# Patient Record
Sex: Female | Born: 1965 | Race: Black or African American | Hispanic: No | Marital: Married | Smoking: Former smoker
Health system: Southern US, Community
[De-identification: ages and names within clinical notes are randomized; demographics above are authoritative.]

## PROBLEM LIST (undated history)

## (undated) DIAGNOSIS — J449 Chronic obstructive pulmonary disease, unspecified: Secondary | ICD-10-CM

## (undated) DIAGNOSIS — I471 Supraventricular tachycardia, unspecified: Secondary | ICD-10-CM

## (undated) DIAGNOSIS — J45909 Unspecified asthma, uncomplicated: Secondary | ICD-10-CM

## (undated) HISTORY — PX: OTHER SURGICAL HISTORY: SHX169

## (undated) HISTORY — DX: Chronic obstructive pulmonary disease, unspecified: J44.9

## (undated) HISTORY — DX: Unspecified asthma, uncomplicated: J45.909

## (undated) HISTORY — DX: Supraventricular tachycardia: I47.1

## (undated) HISTORY — DX: Supraventricular tachycardia, unspecified: I47.10

---

## 2019-03-29 ENCOUNTER — Encounter: Payer: Self-pay | Admitting: *Deleted

## 2019-03-29 NOTE — Progress Notes (Signed)
Oncology Nurse Navigator Documentation  Oncology Nurse Navigator Flowsheets 03/29/2019  Navigator Location [facility]  Referral date to RadOnc/MedOnc 03/29/2019  Navigator Encounter Type Telephone/I received referral on Tonya Gordon.  I called referring cancer center to check on patient's scans CD.  I was told they did not have that.  I called the patient.  I was updated that she does not have that either.  Patient is going to call and have the hospital send her a Cd of her scans.  She will call me back with an update.  I will get her scheduled with Dr. Julien Nordmann after the update. Patient verbalized understanding  Telephone Outgoing Call  Barriers/Navigation Needs Education;Coordination of Care  Education Other  Interventions Coordination of Care;Education  Coordination of Care Other  Education Method Verbal  Acuity Level 2  Time Spent with Patient 73

## 2019-03-30 ENCOUNTER — Ambulatory Visit
Admission: RE | Admit: 2019-03-30 | Discharge: 2019-03-30 | Disposition: A | Discharge status: Home / Self Care | Payer: Self-pay | Source: Ambulatory Visit | Attending: Internal Medicine | Admitting: Internal Medicine

## 2019-03-30 ENCOUNTER — Encounter: Payer: Self-pay | Admitting: *Deleted

## 2019-03-30 ENCOUNTER — Telehealth: Payer: Self-pay | Admitting: *Deleted

## 2019-03-30 DIAGNOSIS — R918 Other nonspecific abnormal finding of lung field: Secondary | ICD-10-CM

## 2019-03-30 NOTE — Telephone Encounter (Signed)
Oncology Nurse Navigator Documentation  Oncology Nurse Navigator Flowsheets 03/30/2019  Navigator Location [facility]  Referral date to RadOnc/MedOnc -  Navigator Encounter Type Telephone;Other/I called [facility] to get scans pushed into pacs.  I completed order.  I called [facility] and was told this was incorrect.  After several phone calls with them and [facility], [facility] will help to get scans pushed into pacs.  I called patient to get her scheduled to see Dr. Julien Nordmann this week.  I was unable to reach her.  I left vm message with my name and phone number to call.   Telephone Outgoing Call  Barriers/Navigation Needs Education;Coordination of Care  Education Other  Interventions Coordination of Care;Education  Coordination of Care Other  Education Method Verbal  Acuity Level 4  Time Spent with Patient > 120

## 2019-03-31 ENCOUNTER — Encounter: Payer: Self-pay | Admitting: *Deleted

## 2019-03-31 DIAGNOSIS — R918 Other nonspecific abnormal finding of lung field: Secondary | ICD-10-CM

## 2019-03-31 NOTE — Progress Notes (Signed)
Oncology Nurse Navigator Documentation  Oncology Nurse Navigator Flowsheets 03/31/2019  Navigator Location [facility]  Referral date to RadOnc/MedOnc -  Navigator Encounter Type Telephone/I received a call from patient. I scheduled her to see Dr. Julien Nordmann tomorrow.  She verbalized understanding of appt time and place.   Telephone Incoming Call  [employer];Coordination of Care  Education Other  Interventions Coordination of Care;Education  Coordination of Care Appts  Education Method Verbal  Acuity Level 1  Time Spent with Patient 30

## 2019-04-01 ENCOUNTER — Inpatient Hospital Stay: Payer: Self-pay | Admitting: Internal Medicine

## 2019-04-01 ENCOUNTER — Inpatient Hospital Stay: Payer: Self-pay

## 2019-04-01 ENCOUNTER — Encounter: Payer: Self-pay | Admitting: *Deleted

## 2019-04-01 ENCOUNTER — Telehealth: Payer: Self-pay | Admitting: *Deleted

## 2019-04-01 NOTE — Telephone Encounter (Signed)
Oncology Nurse Navigator Documentation  Oncology Nurse Navigator Flowsheets 04/01/2019  Navigator Location [facility]  Referral date to RadOnc/MedOnc -  Navigator Encounter Type Telephone/I received a call from patient.  She was updated by her insurance that she is not covered to see Dr. Julien Nordmann.  I contacted managed care to get an update.  I was told with medicare or medaid that pre auth for visit is not needed.  I called patient back put was unable to reach her. I will continue to call her with an update.   Telephone Corporate treasurer Needs Education  Education Other  Interventions Education  Coordination of Care -  Education Method Verbal  Acuity Level 2  Time Spent with Patient 15

## 2019-04-01 NOTE — Progress Notes (Signed)
I received a call from patient. She states she had re applied for medicare in [place] but has been denied due to recent marriage.  I explained to her that we treat those who do not have any insurance.  She states she does not have any money and did not want to accumulate bills.  She also states she will be getting treatment in [place] until she can work out [employer].  I told her I will contact CSW with an update to see what options we had for her.  She was thankful for the call and update.

## 2019-07-16 ENCOUNTER — Telehealth: Payer: Self-pay | Admitting: *Deleted

## 2019-07-16 NOTE — Telephone Encounter (Signed)
Oncology Nurse Navigator Documentation  Oncology Nurse Navigator Flowsheets 07/16/2019  Navigator Location [facility]  Referral Date to RadOnc/MedOnc -  Navigator Encounter Type Telephone/I called to check on Ms. Chandler.  I was unable to reach her or leave a vm message.    Telephone Outgoing Call  Barriers/Navigation Needs -  Education -  Interventions Other  Coordination of Care -  Education Method -  Acuity Level 1  Time Spent with Patient 15

## 2019-08-23 ENCOUNTER — Telehealth: Payer: Self-pay | Admitting: Internal Medicine

## 2019-08-23 NOTE — Telephone Encounter (Signed)
Received a call from Tonya Gordon to schedule a new patient appt for NSCLC. Tonya Gordon has been scheduled to see Dr. Julien Nordmann on 10/12 at 2:15pm w/labs at 1:45pm. She's been made aware to arrive 15 minutes early.

## 2019-09-06 ENCOUNTER — Other Ambulatory Visit: Payer: Self-pay

## 2019-09-06 ENCOUNTER — Inpatient Hospital Stay: Payer: Medicare Other | Attending: Internal Medicine | Admitting: Internal Medicine

## 2019-09-06 ENCOUNTER — Encounter: Payer: Self-pay | Admitting: Internal Medicine

## 2019-09-06 ENCOUNTER — Inpatient Hospital Stay: Payer: Medicare Other

## 2019-09-06 ENCOUNTER — Telehealth: Payer: Self-pay | Admitting: Internal Medicine

## 2019-09-06 VITALS — BP 108/55 | HR 91 | Temp 98.0°F | Resp 18 | Ht 63.5 in | Wt 199.7 lb

## 2019-09-06 DIAGNOSIS — J45909 Unspecified asthma, uncomplicated: Secondary | ICD-10-CM | POA: Diagnosis not present

## 2019-09-06 DIAGNOSIS — C3490 Malignant neoplasm of unspecified part of unspecified bronchus or lung: Secondary | ICD-10-CM

## 2019-09-06 DIAGNOSIS — C3412 Malignant neoplasm of upper lobe, left bronchus or lung: Secondary | ICD-10-CM | POA: Insufficient documentation

## 2019-09-06 DIAGNOSIS — I471 Supraventricular tachycardia: Secondary | ICD-10-CM | POA: Diagnosis not present

## 2019-09-06 DIAGNOSIS — R918 Other nonspecific abnormal finding of lung field: Secondary | ICD-10-CM

## 2019-09-06 DIAGNOSIS — J449 Chronic obstructive pulmonary disease, unspecified: Secondary | ICD-10-CM | POA: Diagnosis not present

## 2019-09-06 DIAGNOSIS — Z7189 Other specified counseling: Secondary | ICD-10-CM

## 2019-09-06 DIAGNOSIS — Z5112 Encounter for antineoplastic immunotherapy: Secondary | ICD-10-CM | POA: Insufficient documentation

## 2019-09-06 DIAGNOSIS — C3492 Malignant neoplasm of unspecified part of left bronchus or lung: Secondary | ICD-10-CM | POA: Insufficient documentation

## 2019-09-06 LAB — CBC WITH DIFFERENTIAL (CANCER CENTER ONLY)
Abs Immature Granulocytes: 0.02 10*3/uL (ref 0.00–0.07)
Basophils Absolute: 0 10*3/uL (ref 0.0–0.1)
Basophils Relative: 0 %
Eosinophils Absolute: 0.3 10*3/uL (ref 0.0–0.5)
Eosinophils Relative: 4 %
HCT: 38.9 % (ref 36.0–46.0)
Hemoglobin: 12.1 g/dL (ref 12.0–15.0)
Immature Granulocytes: 0 %
Lymphocytes Relative: 25 %
Lymphs Abs: 1.5 10*3/uL (ref 0.7–4.0)
MCH: 29.2 pg (ref 26.0–34.0)
MCHC: 31.1 g/dL (ref 30.0–36.0)
MCV: 93.7 fL (ref 80.0–100.0)
Monocytes Absolute: 0.5 10*3/uL (ref 0.1–1.0)
Monocytes Relative: 8 %
Neutro Abs: 3.8 10*3/uL (ref 1.7–7.7)
Neutrophils Relative %: 63 %
Platelet Count: 292 10*3/uL (ref 150–400)
RBC: 4.15 MIL/uL (ref 3.87–5.11)
RDW: 13.2 % (ref 11.5–15.5)
WBC Count: 6 10*3/uL (ref 4.0–10.5)
nRBC: 0 % (ref 0.0–0.2)

## 2019-09-06 LAB — CMP (CANCER CENTER ONLY)
ALT: 23 U/L (ref 0–44)
AST: 18 U/L (ref 15–41)
Albumin: 3.8 g/dL (ref 3.5–5.0)
Alkaline Phosphatase: 74 U/L (ref 38–126)
Anion gap: 6 (ref 5–15)
BUN: 11 mg/dL (ref 6–20)
CO2: 34 mmol/L — ABNORMAL HIGH (ref 22–32)
Calcium: 9.3 mg/dL (ref 8.9–10.3)
Chloride: 101 mmol/L (ref 98–111)
Creatinine: 0.77 mg/dL (ref 0.44–1.00)
GFR, Est AFR Am: >60 mL/min (ref >60)
GFR, Estimated: >60 mL/min (ref >60)
Glucose, Bld: 108 mg/dL — ABNORMAL HIGH (ref 70–99)
Potassium: 4.1 mmol/L (ref 3.5–5.1)
Sodium: 141 mmol/L (ref 135–145)
Total Bilirubin: <0.2 mg/dL — ABNORMAL LOW (ref 0.3–1.2)
Total Protein: 7 g/dL (ref 6.5–8.1)

## 2019-09-06 NOTE — Telephone Encounter (Signed)
Scheduled appt per 10/12 los - gave patient AVS and calender per los

## 2019-09-06 NOTE — Progress Notes (Signed)
START OFF PATHWAY REGIMEN - Non-Small Cell Lung   [id number]:Pembrolizumab 200 mg q21 Days:   A cycle is 21 days:     Pembrolizumab   **Always confirm dose/schedule in your pharmacy ordering system**  Patient Characteristics: Stage IV Metastatic, Nonsquamous, Initial Chemotherapy/Immunotherapy, PS = 0, 1, ALK or EGFR or ROS1 or NTRK or MET or RET Genomic Alterations - Awaiting Test Results AJCC T Category: T2b Current Disease Status: Distant Metastases AJCC N Category: N2 AJCC M Category: M1a AJCC 8 Stage Grouping: IVA Histology: Nonsquamous Cell ROS1 Rearrangement Status: Negative T790M Mutation Status: Not Applicable - EGFR Mutation Negative/Unknown Other Mutations/Biomarkers: No Other Actionable Mutations Chemotherapy/Immunotherapy LOT: Initial Chemotherapy/Immunotherapy Molecular Targeted Therapy: Not Appropriate MET Exon 14 Mutation Status: Awaiting Test Results RET Gene Fusion Status: Awaiting Test Results EGFR Mutation Status: Negative/Wild Type NTRK Gene Fusion Status: Awaiting Test Results PD-L1 Expression Status: PD-L1 Positive ? 50% (TPS) ALK Rearrangement Status: Negative BRAF V600E Mutation Status: Awaiting Test Results ECOG Performance Status: 1 Intent of Therapy: Non-Curative / Palliative Intent, Discussed with Patient

## 2019-09-06 NOTE — Progress Notes (Signed)
[facility] [phone]   [phone]  CONSULT NOTE  REFERRING PHYSICIAN: Dr. Drucilla Chalet  REASON FOR CONSULTATION:  53 years old African-American female with metastatic non-small cell lung cancer.  HPI Tonya Gordon is a 53 y.o. female with past medical history significant for asthma, COPD, SVT, hypertension as well as left upper lobectomy at [facility] in 2012 and the final pathology of the mass was benign tumor.  The patient mentions that in March 2018 she was complaining of shortness of breath and feeling sick all the time.  Imaging studies include chest x-ray followed by CT scan of the chest showed a new central left upper lobe mass measuring 0.4 x 4.5 cm that was not seen on previous scan in 2016.  The scan also showed other pulmonary nodules including right upper lobe 0.6 x 1.1 cm, right middle lobe 0.8 x 0.9 cm in the right lower lobe measuring 0.5 x 0.7 cm that were seen on the previous scan but slightly larger.  It also showed a right hilar lymph node measuring 1.2 x 1.6 and enlarged with station 7 lymph node measuring 1.6 x 2.8 cm.  The patient underwent bronchoscopy with endobronchial ultrasound and biopsy of the left upper lobe lung mass as well as the lymph nodes but unfortunately it was not conclusive for malignancy.  She has not underwent CT-guided core biopsy of the left upper lobe lung mass by interventional radiology on 03/17/2017 and the final pathology from [facility] [phone]) was consistent with poorly differentiated adenocarcinoma.  Molecular studies were negative for EGFR, ALK gene translocation as well as ROS1.  PD-L1 expression was 70%.  A PET scan on 03/03/2017 showed hypermetabolic activity in the left upper lobe lung mass in addition to the right upper lobe lung mass there was low activity in the groundglass opacity in the right lower lobe and no evidence for distant metastatic disease or mediastinal lymph  node involvement.  The patient underwent CT-guided core biopsy of the right upper lobe lung nodule on 04/16/2017 by interventional radiology and the final pathology from [facility] [phone]) was consistent with poorly differentiated adenocarcinoma with negative PDL 1 expression.  The patient underwent a course of concurrent chemoradiation with weekly carboplatin and paclitaxel for 6 weeks to see left upper lobe lung mass.  Repeat imaging studies showed improvement of her disease in the left lung as well as the small right upper lobe lung nodule.  Repeat PET scan on 08/05/2017 showed improvement in the FDG uptake of the left upper lobe lung mass as well as the right upper lobe lung nodule but there was development of hypermetabolic activity in the mediastinal lymph nodes.  The patient was not a good candidate for radiation treatment to the right upper lobe lung nodule.  She was started on treatment with Keytruda 200 mg IV every 3 weeks in October 2018.  She had improvement in her disease that was generally stable to mild improvement on several imaging studies.  She was switched to every 6 weeks schedule for the last 2 cycles of her treatment.  She received the last cycle of her treatment in August 2020.  The patient moved to [facility] and requested to transfer her care to the [facility] and she is here today for evaluation and recommendation regarding her condition. When seen today she is feeling fine except for the shortness of breath that is multifactorial including her history of lung cancer,  previous radiation has COPD.  She also has mild cough but no chest pain or hemoptysis.  She denied having any recent weight loss or night sweats.  She has no nausea, vomiting, diarrhea or constipation.  He has occasional headache but she had MRI of the brain few months ago that was unremarkable. Family history significant for mother with history of breast cancer, hypertension and heart  disease, father had hypertension and stroke and sister had breast cancer. The patient is married and has 4 children.  She used to work as a Freight forwarder at [employer].  She has a history of smoking 1 pack/day for 33 years and quit in 2017.  She drinks alcohol on weekends.  She has no history of drug abuse.  She currently lives in [place] and her daughter lives with her.  She has a son who lives in [place].  HPI  Past Medical History:  Diagnosis Date   Asthma    COPD (chronic obstructive pulmonary disease) (HCC)    SVT (supraventricular tachycardia) (HCC)     Past Surgical History:  Procedure Laterality Date   left upper lobectomy      Family History  Problem Relation Age of Onset   Hypertension Mother     Social History Social History   Tobacco Use   Smoking status: Former Smoker    Packs/day: 1.00    Years: 30.00    Pack years: 30.00    Quit date: 07/06/2016    Years since quitting: 3.1   Smokeless tobacco: Never Used  Substance Use Topics   Alcohol use: Yes    Comment: Once a week   Drug use: Not on file    Not on File  Current Outpatient Medications  Medication Sig Dispense Refill   albuterol (PROVENTIL) (2.5 MG/3ML) 0.083% nebulizer solution Inhale 3 mLs into the lungs every 4 (four) hours as needed.     albuterol (VENTOLIN HFA) 108 (90 Base) MCG/ACT inhaler Inhale 1 puff into the lungs every 4 (four) hours as needed.     budesonide-formoterol (SYMBICORT) 160-4.5 MCG/ACT inhaler Inhale 2 puffs into the lungs 2 (two) times daily.     busPIRone (BUSPAR) 15 MG tablet Take 15 mg by mouth 2 (two) times daily.     desvenlafaxine (PRISTIQ) 50 MG 24 hr tablet Take 50 mg by mouth daily.     diltiazem (CARDIZEM CD) 180 MG 24 hr capsule Take 180 mg by mouth daily.     OXYGEN 2 L.     zolpidem (AMBIEN) 5 MG tablet Take 5 mg by mouth at bedtime.     No current facility-administered medications for this visit.     Review of  Systems  Constitutional: positive for fatigue Eyes: negative Ears, nose, mouth, throat, and face: negative Respiratory: positive for cough and dyspnea on exertion Cardiovascular: negative Gastrointestinal: negative Genitourinary:negative Integument/breast: negative Hematologic/lymphatic: negative Musculoskeletal:positive for muscle weakness Neurological: negative Behavioral/Psych: negative Endocrine: negative Allergic/Immunologic: negative  Physical Exam  QJJ:HERDE, healthy, no distress, well nourished and well developed SKIN: skin color, texture, turgor are normal, no rashes or significant lesions HEAD: Normocephalic, No masses, lesions, tenderness or abnormalities EYES: normal, PERRLA, Conjunctiva are pink and non-injected EARS: External ears normal, Canals clear OROPHARYNX:no exudate, no erythema and lips, buccal mucosa, and tongue normal  NECK: supple, no adenopathy, no JVD LYMPH:  no palpable lymphadenopathy, no hepatosplenomegaly BREAST:not examined LUNGS: clear to auscultation , and palpation HEART: regular rate & rhythm, no murmurs and no gallops ABDOMEN:abdomen soft, non-tender, obese, normal  bowel sounds and no masses or organomegaly BACK: Back symmetric, no curvature., No CVA tenderness EXTREMITIES:no joint deformities, effusion, or inflammation, no edema  NEURO: alert & oriented x 3 with fluent speech, no focal motor/sensory deficits  PERFORMANCE STATUS: ECOG 1  LABORATORY DATA: Lab Results  Component Value Date   WBC 6.0 09/06/2019   HGB 12.1 09/06/2019   HCT 38.9 09/06/2019   MCV 93.7 09/06/2019   PLT 292 09/06/2019      Chemistry   No results found for: NA, K, CL, CO2, BUN, CREATININE, GLU No results found for: CALCIUM, ALKPHOS, AST, ALT, BILITOT     RADIOGRAPHIC STUDIES: No results found.  ASSESSMENT: This is a very pleasant 53 years old African-American female diagnosed with a stage IV non-small cell lung cancer, poorly differentiated  adenocarcinoma in April 2017 and presented with large left upper lobe lung mass in addition to right upper lobe nodule and mediastinal lymphadenopathy.  She is status post a course of concurrent chemoradiation with weekly carboplatin and paclitaxel.  The patient then started treatment with Keytruda 200 mg IV every 3 weeks in October 2018 and has been on this treatment since that time with no concerning issues.  Her last dose of the treatment was given in August 2020.    PLAN: I had a lengthy discussion with the patient today about her current disease stage, prognosis and treatment options. I spent a significant amount of time reviewing her previous records from her previous physician at [facility]. I recommended for the patient to have repeat CT scan of the chest, abdomen and pelvis for restaging of her disease and to have a baseline scan in [place] before resuming her treatment. I recommended for the patient to resume her treatment with Keytruda at a dose of 200 mg IV every 3 weeks. I reminded the patient of the adverse effect of this treatment including but not limited to immunotherapy mediated skin rash, diarrhea, inflammation of the lung, kidney, liver, thyroid or other endocrine dysfunction. I also asked the patient to have his blood test sent to [facility] for molecular studies since her previous molecular studies included only EGFR, ALK and ROS1. I will arrange for the patient to come back for follow-up visit in 1 week with the start of the first cycle of her treatment and also to review her scan. She was advised to call immediately if she has any concerning symptoms in the interval.  The patient voices understanding of current disease status and treatment options and is in agreement with the current care plan.  All questions were answered. The patient knows to call the clinic with any problems, questions or concerns. We can certainly see the patient much sooner if necessary.  Thank  you so much for allowing me to participate in the care of [place]. I will continue to follow up the patient with you and assist in her care.  I spent 55 minutes counseling the patient face to face. The total time spent in the appointment was 80 minutes.  Disclaimer: This note was dictated with voice recognition software. Similar sounding words can inadvertently be transcribed and may not be corrected upon review.   Tonya Gordon September 06, 2019, 2:03 PM

## 2019-09-10 ENCOUNTER — Ambulatory Visit (HOSPITAL_COMMUNITY): Payer: Medicare Other

## 2019-09-14 ENCOUNTER — Inpatient Hospital Stay: Payer: Medicare Other

## 2019-09-14 ENCOUNTER — Telehealth: Payer: Self-pay | Admitting: *Deleted

## 2019-09-14 NOTE — Telephone Encounter (Signed)
Ok to cancel all her appointment.  Thank you.

## 2019-09-14 NOTE — Telephone Encounter (Signed)
Received call from pt requesting to cancel all her appts. She states Dr. Julien Nordmann is out of network per her insurance company and she can't afford out of network payments.. Advised that she was to meet with Stefanie Libel in Financial assistance today.  She stated she would call her-phone # provided, along with billing #.  Pt states she has been treated prior to this in [place] and will return there for ongoing treatments. She states she has an appt there next Wednesday, 09/22/19. Pt states that she would contact us in the future if her insurance issues are resolved.

## 2019-09-15 ENCOUNTER — Inpatient Hospital Stay: Payer: Medicare Other

## 2019-09-15 ENCOUNTER — Inpatient Hospital Stay: Payer: Medicare Other | Admitting: Physician Assistant

## 2019-09-16 ENCOUNTER — Other Ambulatory Visit: Payer: Self-pay | Admitting: Internal Medicine

## 2019-09-16 DIAGNOSIS — C3492 Malignant neoplasm of unspecified part of left bronchus or lung: Secondary | ICD-10-CM

## 2019-09-20 ENCOUNTER — Ambulatory Visit (HOSPITAL_COMMUNITY): Payer: Medicare Other

## 2019-09-30 LAB — GUARDANT 360

## 2019-10-06 ENCOUNTER — Ambulatory Visit: Payer: Medicare Other | Admitting: Physician Assistant

## 2019-10-06 ENCOUNTER — Ambulatory Visit: Payer: Medicare Other

## 2019-10-06 ENCOUNTER — Other Ambulatory Visit: Payer: Medicare Other

## 2019-10-27 ENCOUNTER — Other Ambulatory Visit: Payer: Medicare Other

## 2019-10-27 ENCOUNTER — Ambulatory Visit: Payer: Medicare Other | Admitting: Internal Medicine

## 2019-10-27 ENCOUNTER — Ambulatory Visit: Payer: Medicare Other

## 2019-11-03 ENCOUNTER — Encounter: Payer: Self-pay | Admitting: *Deleted

## 2020-03-15 ENCOUNTER — Inpatient Hospital Stay
Admit: 2020-03-15 | Discharge: 2020-03-16 | Disposition: A | Discharge status: Home / Self Care | Payer: MEDICARE | Attending: Emergency Medicine

## 2020-03-15 DIAGNOSIS — A5901 Trichomonal vulvovaginitis: Secondary | ICD-10-CM

## 2020-03-15 NOTE — ED Provider Notes (Signed)
EMERGENCY DEPARTMENT HISTORY AND PHYSICAL EXAM    10:21 PM  Date: 03/15/2020  Patient Name: Cynthia Sawyer    History of Presenting Illness     Chief Complaint   Patient presents with   ??? Vaginal Discharge   ??? Urinary Pain   ??? Back Pain        History Provided By: Patient    HPI: Cynthia Sawyer is a 54 y.o. female with history of COPD and remote history of cancer in remission.  Patient is presenting with dysuria and right flank pain as well as vaginal discharge for the past 2 days.  Back pain is worse with movement and does not radiate to her lower extremities.  Not associated with weakness or numbness or loss of bowel bladder control.  Patient states that she is monogamous with her husband.    Location:  Severity:  Timing/course:   Onset/Duration:     PCP: Joanie Coddington, PA-C    Past History     Past Medical History:  No past medical history on file.    Past Surgical History:  No past surgical history on file.    Family History:  No family history on file.    Social History:  Social History     Tobacco Use   ??? Smoking status: Not on file   Substance Use Topics   ??? Alcohol use: Not on file   ??? Drug use: Not on file       Allergies:  No Known Allergies    Review of Systems   Review of Systems   Genitourinary: Positive for difficulty urinating, dysuria, flank pain and vaginal discharge.   All other systems reviewed and are negative.       Physical Exam     Patient Vitals for the past 12 hrs:   Temp Pulse Resp BP SpO2   03/15/20 2114 98 ??F (36.7 ??C) 89 20 136/82 94 %       Physical Exam  Vitals signs and nursing note reviewed.   Constitutional:       Appearance: Normal appearance.   HENT:      Head: Normocephalic and atraumatic.   Eyes:      Extraocular Movements: Extraocular movements intact.   Neck:      Musculoskeletal: Normal range of motion and neck supple.   Cardiovascular:      Rate and Rhythm: Normal rate.   Pulmonary:      Effort: Pulmonary effort is normal. No respiratory distress.   Abdominal:      Palpations:  Abdomen is soft.      Tenderness: There is no abdominal tenderness.   Musculoskeletal: Normal range of motion.   Skin:     General: Skin is warm and dry.   Neurological:      General: No focal deficit present.      Mental Status: She is alert and oriented to person, place, and time.   Psychiatric:         Mood and Affect: Mood normal.         Behavior: Behavior normal.         Diagnostic Study Results     Labs -  Recent Results (from the past 12 hour(s))   URINALYSIS W/ RFLX MICROSCOPIC    Collection Time: 03/15/20  9:54 PM   Result Value Ref Range    Color YELLOW      Appearance TURBID      Specific gravity 1.021 1.005 - 1.030  pH (UA) 5.5 5.0 - 8.0      Protein TRACE (A) NEG mg/dL    Glucose Negative NEG mg/dL    Ketone TRACE (A) NEG mg/dL    Bilirubin Negative NEG      Blood SMALL (A) NEG      Urobilinogen 0.2 0.2 - 1.0 EU/dL    Nitrites Negative NEG      Leukocyte Esterase LARGE (A) NEG     WET PREP    Collection Time: 03/15/20  9:54 PM    Specimen: Vagina   Result Value Ref Range    Special Requests: NO SPECIAL REQUESTS      Wet prep MODERATE  MOTILE TRICHOMONAS NOTED        Wet prep NO YEAST SEEN      Wet prep CLUE CELLS ABSENT     URINE MICROSCOPIC ONLY    Collection Time: 03/15/20  9:54 PM   Result Value Ref Range    WBC TOO NUMEROUS TO COUNT 0 - 4 /hpf    RBC 0 to 3 0 - 5 /hpf    Epithelial cells 2+ 0 - 5 /lpf    Bacteria 4+ (A) NEG /hpf    Trichomonas 2+ (A) NEG       Radiologic Studies -   No results found.      Medical Decision Making     ED Course: Progress Notes, Reevaluation, and Consults:    10:21 PM Initial assessment performed. The patients presenting problems have been discussed, and they/their family are in agreement with the care plan formulated and outlined with them.  I have encouraged them to ask questions as they arise throughout their visit.        Provider Notes (Medical Decision Making): 54 year old female presenting with dysuria, vaginal discharge and right flank pain.  Symptoms of  UTI.  Will also send swabs for the vaginal discharge.  She has no abdominal tenderness or ovarian tenderness.  Back pain is reproducible, likely musculoskeletal.    Patient has UTI and trichomonas.  Discussed the results with her and she agreed on empiric treatment for STDs.  Instructed her to inform her husband that he needs to be treated as well.    Procedures:     Critical Care Time:     Vital Signs-Reviewed the patient's vital signs. Reviewed pt's pulse ox reading.     EKG:  Interpreted by the EP.   Time Interpreted:    Rate:    Rhythm:    Interpretation:   Comparison:     Records Reviewed: Nursing Notes (Time of Review: 10:21 PM)  -I am the first provider for this patient.  -I reviewed the vital signs, available nursing notes, past medical history, past surgical history, family history and social history.         Clinical Impression     Clinical Impression: No diagnosis found.    Disposition: DC      This note was dictated utilizing voice recognition software which may lead to typographical errors.  I apologize in advance if the situation occurs.  If questions arise please do not hesitate to contact me or call our department.    Lafayette Dragon, MD  10:21 PM

## 2020-03-15 NOTE — ED Notes (Signed)
Patient A/O x 4, presented to the ED with complaint of lower right sided back pain radiating down leg,  pain on urination, vaginal discharge x yesterday. Patient denies fever, body aches, chills and other complaints.

## 2020-03-15 NOTE — ED Triage Notes (Signed)
Patient A/O x 4, presented to the ED with complaint of lower right sided back pain radiating down leg,  pain on urination, vaginal discharge x yesterday. Patient denies fever, body aches, chills and other complaints.

## 2020-03-15 NOTE — ED Provider Notes (Signed)
EMERGENCY DEPARTMENT HISTORY AND PHYSICAL EXAM    10:21 PM  Date: 03/15/2020  Patient Name: Cynthia Sawyer    History of Presenting Illness     Chief Complaint   Patient presents with   ??? Vaginal Discharge   ??? Urinary Pain   ??? Back Pain        History Provided By: Patient    HPI: Cynthia Sawyer is a 54 y.o. female with history of COPD and remote history of cancer in remission.  Patient is presenting with dysuria and right flank pain as well as vaginal discharge for the past 2 days.  Back pain is worse with movement and does not radiate to her lower extremities.  Not associated with weakness or numbness or loss of bowel bladder control.  Patient states that she is monogamous with her husband.    Location:  Severity:  Timing/course:   Onset/Duration:     PCP: Joanie Coddington, PA-C    Past History     Past Medical History:  No past medical history on file.    Past Surgical History:  No past surgical history on file.    Family History:  No family history on file.    Social History:  Social History     Tobacco Use   ??? Smoking status: Not on file   Substance Use Topics   ??? Alcohol use: Not on file   ??? Drug use: Not on file       Allergies:  No Known Allergies    Review of Systems   Review of Systems   Genitourinary: Positive for difficulty urinating, dysuria, flank pain and vaginal discharge.   All other systems reviewed and are negative.       Physical Exam     Patient Vitals for the past 12 hrs:   Temp Pulse Resp BP SpO2   03/15/20 2114 98 ??F (36.7 ??C) 89 20 136/82 94 %       Physical Exam  Vitals signs and nursing note reviewed.   Constitutional:       Appearance: Normal appearance.   HENT:      Head: Normocephalic and atraumatic.   Eyes:      Extraocular Movements: Extraocular movements intact.   Neck:      Musculoskeletal: Normal range of motion and neck supple.   Cardiovascular:      Rate and Rhythm: Normal rate.   Pulmonary:      Effort: Pulmonary effort is normal. No respiratory distress.   Abdominal:      Palpations:  Abdomen is soft.      Tenderness: There is no abdominal tenderness.   Musculoskeletal: Normal range of motion.   Skin:     General: Skin is warm and dry.   Neurological:      General: No focal deficit present.      Mental Status: She is alert and oriented to person, place, and time.   Psychiatric:         Mood and Affect: Mood normal.         Behavior: Behavior normal.         Diagnostic Study Results     Labs -  Recent Results (from the past 12 hour(s))   URINALYSIS W/ RFLX MICROSCOPIC    Collection Time: 03/15/20  9:54 PM   Result Value Ref Range    Color YELLOW      Appearance TURBID      Specific gravity 1.021 1.005 - 1.030  pH (UA) 5.5 5.0 - 8.0      Protein TRACE (A) NEG mg/dL    Glucose Negative NEG mg/dL    Ketone TRACE (A) NEG mg/dL    Bilirubin Negative NEG      Blood SMALL (A) NEG      Urobilinogen 0.2 0.2 - 1.0 EU/dL    Nitrites Negative NEG      Leukocyte Esterase LARGE (A) NEG     WET PREP    Collection Time: 03/15/20  9:54 PM    Specimen: Vagina   Result Value Ref Range    Special Requests: NO SPECIAL REQUESTS      Wet prep MODERATE  MOTILE TRICHOMONAS NOTED        Wet prep NO YEAST SEEN      Wet prep CLUE CELLS ABSENT     URINE MICROSCOPIC ONLY    Collection Time: 03/15/20  9:54 PM   Result Value Ref Range    WBC TOO NUMEROUS TO COUNT 0 - 4 /hpf    RBC 0 to 3 0 - 5 /hpf    Epithelial cells 2+ 0 - 5 /lpf    Bacteria 4+ (A) NEG /hpf    Trichomonas 2+ (A) NEG       Radiologic Studies -   No results found.      Medical Decision Making     ED Course: Progress Notes, Reevaluation, and Consults:    10:21 PM Initial assessment performed. The patients presenting problems have been discussed, and they/their family are in agreement with the care plan formulated and outlined with them.  I have encouraged them to ask questions as they arise throughout their visit.        Provider Notes (Medical Decision Making): 53-year-old female presenting with dysuria, vaginal discharge and right flank pain.  Symptoms of  UTI.  Will also send swabs for the vaginal discharge.  She has no abdominal tenderness or ovarian tenderness.  Back pain is reproducible, likely musculoskeletal.    Patient has UTI and trichomonas.  Discussed the results with her and she agreed on empiric treatment for STDs.  Instructed her to inform her husband that he needs to be treated as well.    Procedures:     Critical Care Time:     Vital Signs-Reviewed the patient's vital signs. Reviewed pt's pulse ox reading.     EKG:  Interpreted by the EP.   Time Interpreted:    Rate:    Rhythm:    Interpretation:   Comparison:     Records Reviewed: Nursing Notes (Time of Review: 10:21 PM)  -I am the first provider for this patient.  -I reviewed the vital signs, available nursing notes, past medical history, past surgical history, family history and social history.         Clinical Impression     Clinical Impression: No diagnosis found.    Disposition: DC      This note was dictated utilizing voice recognition software which may lead to typographical errors.  I apologize in advance if the situation occurs.  If questions arise please do not hesitate to contact me or call our department.    Matilde Markie A Shabazz Mckey, MD  10:21 PM

## 2020-03-16 LAB — URINALYSIS W/ RFLX MICROSCOPIC
Bilirubin, Urine: NEGATIVE
Bilirubin: NEGATIVE
Glucose, Ur: NEGATIVE mg/dL
Glucose: NEGATIVE mg/dL
Nitrite, Urine: NEGATIVE
Nitrites: NEGATIVE
Specific Gravity, UA: 1.021 (ref 1.005–1.030)
Specific gravity: 1.021 (ref 1.005–1.030)
Urobilinogen, UA, POCT: 0.2 EU/dL (ref 0.2–1.0)
Urobilinogen: 0.2 EU/dL (ref 0.2–1.0)
pH (UA): 5.5 (ref 5.0–8.0)
pH, UA: 5.5 (ref 5.0–8.0)

## 2020-03-16 LAB — URINE MICROSCOPIC ONLY
RBC, UA: 0 /hpf (ref 0–5)
RBC: 0 /hpf (ref 0–5)

## 2020-03-16 LAB — WET PREP
Wet Prep: ABSENT
Wet Prep: NONE SEEN
Wet prep: ABSENT
Wet prep: NONE SEEN

## 2020-03-16 MED ORDER — LIDOCAINE (PF) 10 MG/ML (1 %) IJ SOLN
101 mg/mL (1 %) | Freq: Once | INTRAMUSCULAR | Status: AC
Start: 2020-03-16 — End: 2020-03-15
  Administered 2020-03-16: 03:00:00 via INTRAMUSCULAR

## 2020-03-16 MED ORDER — METRONIDAZOLE 500 MG TAB
500 mg | ORAL_TABLET | Freq: Two times a day (BID) | ORAL | 0 refills | Status: AC
Start: 2020-03-16 — End: 2020-03-22

## 2020-03-16 MED ORDER — DOXYCYCLINE HYCLATE 100 MG CAP
100 mg | ORAL | Status: AC
Start: 2020-03-16 — End: 2020-03-15
  Administered 2020-03-16: 03:00:00 via ORAL

## 2020-03-16 MED ORDER — DOXYCYCLINE 100 MG CAP
100 mg | ORAL_CAPSULE | Freq: Two times a day (BID) | ORAL | 0 refills | Status: AC
Start: 2020-03-16 — End: 2020-03-22

## 2020-03-16 MED ORDER — METRONIDAZOLE 500 MG TAB
500 mg | ORAL | Status: AC
Start: 2020-03-16 — End: 2020-03-15
  Administered 2020-03-16: 03:00:00 via ORAL

## 2020-03-16 MED ORDER — FLUCONAZOLE 150 MG TAB
150 mg | ORAL_TABLET | ORAL | 0 refills | Status: AC
Start: 2020-03-16 — End: 2020-03-16

## 2020-03-16 MED FILL — METRONIDAZOLE 500 MG TAB: 500 mg | ORAL | Qty: 1

## 2020-03-16 MED FILL — CEFTRIAXONE 500 MG SOLUTION FOR INJECTION: 500 mg | INTRAMUSCULAR | Qty: 500

## 2020-03-16 MED FILL — DOXYCYCLINE HYCLATE 100 MG CAP: 100 mg | ORAL | Qty: 1

## 2020-03-16 NOTE — ED Notes (Signed)
I have reviewed discharge instructions with the patient.  The patient verbalized understanding. Patient looks comfortable, left ED in stable condition. Ambulatory, steady gait noted. No adverse reaction noted after receiving medication.

## 2020-03-16 NOTE — ED Notes (Addendum)
I have reviewed discharge instructions with the patient.  The patient verbalized understanding. Patient looks comfortable, left ED in stable condition. Ambulatory, steady gait noted. No adverse reaction noted after receiving medication.

## 2020-03-18 LAB — CT/NG/T.VAGINALIS AMPLIFICATION
C. trachomatis by NAA: NEGATIVE
CHLAMYDIA BY NAA, 183161: NEGATIVE
Gonococcus by NAA: NEGATIVE
N. gonorrhoeae by NAA: NEGATIVE
T. vaginalis by NAA: POSITIVE — AB
TRICH VAG BY NAA: POSITIVE — AB

## 2020-04-19 ENCOUNTER — Telehealth: Payer: Self-pay | Admitting: Internal Medicine

## 2020-04-19 NOTE — Telephone Encounter (Signed)
Faxed medical records to [facility] at [phone], Release [id number]

## 2020-07-01 ENCOUNTER — Other Ambulatory Visit: Payer: Self-pay

## 2020-07-01 ENCOUNTER — Emergency Department (HOSPITAL_COMMUNITY)
Admission: EM | Admit: 2020-07-01 | Discharge: 2020-07-01 | Disposition: A | Discharge status: Home / Self Care | Payer: Medicare Other | Attending: Emergency Medicine | Admitting: Emergency Medicine

## 2020-07-01 ENCOUNTER — Encounter (HOSPITAL_COMMUNITY): Payer: Self-pay

## 2020-07-01 ENCOUNTER — Emergency Department (HOSPITAL_COMMUNITY): Payer: Medicare Other

## 2020-07-01 DIAGNOSIS — J449 Chronic obstructive pulmonary disease, unspecified: Secondary | ICD-10-CM | POA: Insufficient documentation

## 2020-07-01 DIAGNOSIS — R0602 Shortness of breath: Secondary | ICD-10-CM | POA: Diagnosis present

## 2020-07-01 DIAGNOSIS — Z7951 Long term (current) use of inhaled steroids: Secondary | ICD-10-CM | POA: Insufficient documentation

## 2020-07-01 DIAGNOSIS — J45909 Unspecified asthma, uncomplicated: Secondary | ICD-10-CM | POA: Diagnosis not present

## 2020-07-01 DIAGNOSIS — Z9981 Dependence on supplemental oxygen: Secondary | ICD-10-CM | POA: Diagnosis not present

## 2020-07-01 DIAGNOSIS — Z87891 Personal history of nicotine dependence: Secondary | ICD-10-CM | POA: Diagnosis not present

## 2020-07-01 DIAGNOSIS — R0981 Nasal congestion: Secondary | ICD-10-CM | POA: Diagnosis not present

## 2020-07-01 LAB — CBC WITH DIFFERENTIAL/PLATELET
Abs Immature Granulocytes: 0.01 10*3/uL (ref 0.00–0.07)
Basophils Absolute: 0 10*3/uL (ref 0.0–0.1)
Basophils Relative: 1 %
Eosinophils Absolute: 0.1 10*3/uL (ref 0.0–0.5)
Eosinophils Relative: 2 %
HCT: 40 % (ref 36.0–46.0)
Hemoglobin: 12.2 g/dL (ref 12.0–15.0)
Immature Granulocytes: 0 %
Lymphocytes Relative: 31 %
Lymphs Abs: 1.6 10*3/uL (ref 0.7–4.0)
MCH: 28.5 pg (ref 26.0–34.0)
MCHC: 30.5 g/dL (ref 30.0–36.0)
MCV: 93.5 fL (ref 80.0–100.0)
Monocytes Absolute: 0.4 10*3/uL (ref 0.1–1.0)
Monocytes Relative: 8 %
Neutro Abs: 3.1 10*3/uL (ref 1.7–7.7)
Neutrophils Relative %: 58 %
Platelets: 370 10*3/uL (ref 150–400)
RBC: 4.28 MIL/uL (ref 3.87–5.11)
RDW: 14.2 % (ref 11.5–15.5)
WBC: 5.4 10*3/uL (ref 4.0–10.5)
nRBC: 0 % (ref 0.0–0.2)

## 2020-07-01 LAB — BASIC METABOLIC PANEL
Anion gap: 10 (ref 5–15)
BUN: 7 mg/dL (ref 6–20)
CO2: 30 mmol/L (ref 22–32)
Calcium: 9.4 mg/dL (ref 8.9–10.3)
Chloride: 101 mmol/L (ref 98–111)
Creatinine, Ser: 0.76 mg/dL (ref 0.44–1.00)
GFR calc Af Amer: >60 mL/min (ref >60)
GFR calc non Af Amer: >60 mL/min (ref >60)
Glucose, Bld: 93 mg/dL (ref 70–99)
Potassium: 3.9 mmol/L (ref 3.5–5.1)
Sodium: 141 mmol/L (ref 135–145)

## 2020-07-01 LAB — TROPONIN I (HIGH SENSITIVITY): Troponin I (High Sensitivity): 5 ng/L (ref <18)

## 2020-07-01 NOTE — ED Triage Notes (Signed)
Pt reports her oxygen has been in the 70's since last night, uses 2L o2 [place] all the time. sats here on 2L [place] are 95% pt states her home unit must be messed up. Pt also c/o sinus pressure x4 days and feeling weak all over. Pt received her first covid vaccine 2 weeks ago

## 2020-07-01 NOTE — ED Provider Notes (Signed)
[facility] EMERGENCY DEPARTMENT Provider Note   CSN: [id number] Arrival date & time: 07/01/20  1207     History Chief Complaint  Patient presents with  . Shortness of Breath    Tonya Gordon is a 54 y.o. female with a past medical history of COPD on 2 L of oxygen via nasal cannula at baseline, stage IV lung cancer in remission, presenting to the ED with a chief complaint of generalized weakness, sinus pain and pressure and shortness of breath.  Symptoms began yesterday.  She states that this morning when she was on her way to the ER, switch to her portable oxygen machine.  She noticed that her symptoms improved and she is concerned that there may be an issue with her home oxygen tank.  She reports sinus pain and pressure for 4 days.  Denies any fevers, chest pain, leg swelling, abdominal pain.  Reports normal appetite and activity level.  She has been vaccinated against Covid.  HPI     Past Medical History:  Diagnosis Date  . Asthma   . COPD (chronic obstructive pulmonary disease) ([place])   . SVT (supraventricular tachycardia) [facility])     Patient Active Problem List   Diagnosis Date Noted  . Adenocarcinoma of left lung, stage 4 ([place]) 09/06/2019  . Goals of care, counseling/discussion 09/06/2019  . Encounter for antineoplastic immunotherapy 09/06/2019    Past Surgical History:  Procedure Laterality Date  . left upper lobectomy       OB History   No obstetric history on file.     Family History  Problem Relation Age of Onset  . Hypertension Mother     Social History   Tobacco Use  . Smoking status: Former Smoker    Packs/day: 1.00    Years: 30.00    Pack years: 30.00    Quit date: 07/06/2016    Years since quitting: 3.9  . Smokeless tobacco: Never Used  Vaping Use  . Vaping Use: Never assessed  Substance Use Topics  . Alcohol use: Yes    Comment: Once a week  . Drug use: Not on file    Home Medications Prior to Admission medications     Medication Sig Start Date End Date Taking? Authorizing Provider  albuterol (PROVENTIL) (2.5 MG/3ML) 0.083% nebulizer solution Inhale 3 mLs into the lungs every 4 (four) hours as needed. 07/09/16   [provider]  albuterol (VENTOLIN HFA) 108 (90 Base) MCG/ACT inhaler Inhale 1 puff into the lungs every 4 (four) hours as needed. 02/16/17   [provider]  budesonide-formoterol (SYMBICORT) 160-4.5 MCG/ACT inhaler Inhale 2 puffs into the lungs 2 (two) times daily.    [provider]  busPIRone (BUSPAR) 15 MG tablet Take 15 mg by mouth 2 (two) times daily. 10/01/18   [provider]  desvenlafaxine (PRISTIQ) 50 MG 24 hr tablet Take 50 mg by mouth daily. 10/01/18   [provider]  diltiazem (CARDIZEM CD) 180 MG 24 hr capsule Take 180 mg by mouth daily. 03/04/19   [provider]  OXYGEN 2 L.    [provider]  zolpidem (AMBIEN) 5 MG tablet Take 5 mg by mouth at bedtime. 10/01/18   [provider]    Allergies    Clarithromycin  Review of Systems   Review of Systems  Constitutional: Positive for fatigue. Negative for appetite change, chills and fever.  HENT: Positive for sinus pressure. Negative for ear pain, rhinorrhea, sneezing and sore throat.  Eyes: Negative for photophobia and visual disturbance.  Respiratory: Positive for shortness of breath. Negative for cough, chest tightness and wheezing.   Cardiovascular: Negative for chest pain and palpitations.  Gastrointestinal: Negative for abdominal pain, blood in stool, constipation, diarrhea, nausea and vomiting.  Genitourinary: Negative for dysuria, hematuria and urgency.  Musculoskeletal: Negative for myalgias.  Skin: Negative for rash.  Neurological: Negative for dizziness, weakness and light-headedness.    Physical Exam Updated Vital Signs BP 124/78   Pulse 68   Temp 98.7 F (37.1 C) (Oral)   Resp 17   Ht 5\' 3"  (1.6 m)   Wt 86.2 kg   SpO2 99%   BMI 33.66 kg/m    Physical Exam Vitals and nursing note reviewed.  Constitutional:      General: She is not in acute distress.    Appearance: She is well-developed.     Comments: 2L of oxygen via nasal cannula. Speaking in complete sentences without difficulty.  HENT:     Head: Normocephalic and atraumatic.     Nose: Nose normal.  Eyes:     General: No scleral icterus.       Left eye: No discharge.     Conjunctiva/sclera: Conjunctivae normal.  Cardiovascular:     Rate and Rhythm: Normal rate and regular rhythm.     Heart sounds: Normal heart sounds. No murmur heard.  No friction rub. No gallop.   Pulmonary:     Effort: Pulmonary effort is normal. No respiratory distress.     Breath sounds: Normal breath sounds.  Abdominal:     General: Bowel sounds are normal. There is no distension.     Palpations: Abdomen is soft.     Tenderness: There is no abdominal tenderness. There is no guarding.  Musculoskeletal:        General: Normal range of motion.     Cervical back: Normal range of motion and neck supple.  Skin:    General: Skin is warm and dry.     Findings: No rash.  Neurological:     General: No focal deficit present.     Mental Status: She is alert and oriented to person, place, and time.     Cranial Nerves: No cranial nerve deficit.     Motor: No weakness or abnormal muscle tone.     Coordination: Coordination normal.     ED Results / Procedures / Treatments   Labs (all labs ordered are listed, but only abnormal results are displayed) Labs Reviewed  BASIC METABOLIC PANEL  CBC WITH DIFFERENTIAL/PLATELET  TROPONIN I (HIGH SENSITIVITY)    EKG EKG Interpretation  Date/Time:  Saturday July 01 2020 15:45:23 EDT Ventricular Rate:  67 PR Interval:    QRS Duration: 89 QT Interval:  449 QTC Calculation: 474 R Axis:   92 Text Interpretation: Sinus rhythm Borderline right axis deviation Nonspecific T abnrm, anterolateral leads ST elev, probable normal early repol pattern No  reciprocal depressions, recommend clinical correlation Confirmed by Octaviano Glow [phone]) on 07/01/2020 4:10:21 PM   Radiology DG Chest 2 View  Result Date: 07/01/2020 CLINICAL DATA:  Dyspnea since yesterday.  COPD. EXAM: CHEST - 2 VIEW COMPARISON:  04/21/2019 chest CT FINDINGS: Normal heart size. Normal mediastinal contour. No pneumothorax. Hyperinflated lungs. Chronic blunting at the left costophrenic angle is unchanged. No pleural effusions. No overt pulmonary edema. Curvilinear scarring at left greater than right lung bases is unchanged. No acute consolidative airspace disease. IMPRESSION: 1. No acute cardiopulmonary disease. 2. Hyperinflated lungs, compatible with reported  COPD. 3. Chronic pleural-parenchymal scarring at the left lung base. Electronically Signed   By: Ilona Sorrel M.D.   On: 07/01/2020 16:30    Procedures Procedures (including critical care time)  Medications Ordered in ED Medications - No data to display  ED Course  I have reviewed the triage vital signs and the nursing notes.  Pertinent labs & imaging results that were available during my care of the patient were reviewed by me and considered in my medical decision making (see chart for details).    MDM Rules/Calculators/A&P                          54 year old female with a past medical history of COPD on 2 L of oxygen by nasal cannula at baseline, stage IV lung cancer in remission presenting to the ED with a chief complaint of generalized weakness, sinus pain and pressure and shortness of breath.  Symptoms began yesterday.  She was concerned that it was due to an issue with her home oxygen machine.  When she switched to her portable oxygen was seen to come to the ER her symptoms improved.  Reports sinus pressure for 4 days.  Denies any headache otherwise, no vision changes, fever, chest pain, abdominal pain, vomiting, injuries or falls.  On exam patient is on her 2 L of oxygen by nasal cannula and speaking complete  sentences without signs of respiratory distress.  Lungs are clear to auscultation bilaterally.  No neurological deficits noted.  EKG shows nonspecific T wave abnormalities in anterior lateral ST elevation.  No reciprocal depressions.  Chest x-ray shows chronic findings related to COPD.  CBC, BMP are unremarkable.  Troponin was obtained due to patient's vague and generalized symptoms as well as her abnormal EKG.  This was negative.  Patient remains in no acute distress at this time.  She does not appear in respiratory distress.  I feel that this is more likely related to failure of her home oxygen machine.  Fortunately her husband was able to call the company and they have switched out the machine while she has been in the ER.  She voices no further complaints.  She is requesting discharge.  We will have her return for worsening symptoms.   Patient is hemodynamically stable, in NAD, and able to ambulate in the ED. Evaluation does not show pathology that would require ongoing emergent intervention or inpatient treatment. I explained the diagnosis to the patient. Pain has been managed and has no complaints prior to discharge. Patient is comfortable with above plan and is stable for discharge at this time. All questions were answered prior to disposition. Strict return precautions for returning to the ED were discussed. Encouraged follow up with PCP.   An After Visit Summary was printed and given to the patient.   Portions of this note were generated with [employer]. Dictation errors may occur despite best attempts at proofreading.  Final Clinical Impression(s) / ED Diagnoses Final diagnoses:  On home oxygen therapy  Sinus congestion    Rx / DC Orders ED Discharge Orders    None       Delia Heady, PA-C 07/01/20 1731    Wyvonnia Dusky, MD 07/02/20 [phone]

## 2020-07-01 NOTE — ED Notes (Signed)
Pt stable upon discharge. Discharge instructions reviewed with pt and spouse. Both verbalized understanding.

## 2020-07-01 NOTE — Discharge Instructions (Addendum)
Continue your home medications as previously prescribed. Follow-up with your primary care provider. Return to the ER if you start to develop worsening shortness of breath, if you develop chest pain, injuries or falls, leg swelling.

## 2021-10-21 ENCOUNTER — Encounter: Payer: Self-pay | Admitting: Internal Medicine

## 2021-10-21 ENCOUNTER — Emergency Department (HOSPITAL_COMMUNITY): Payer: Medicare Other

## 2021-10-21 ENCOUNTER — Other Ambulatory Visit: Payer: Self-pay

## 2021-10-21 ENCOUNTER — Emergency Department (HOSPITAL_COMMUNITY)
Admission: EM | Admit: 2021-10-21 | Discharge: 2021-10-21 | Disposition: A | Discharge status: Home / Self Care | Payer: Medicare Other | Attending: Emergency Medicine | Admitting: Emergency Medicine

## 2021-10-21 ENCOUNTER — Encounter (HOSPITAL_COMMUNITY): Payer: Self-pay | Admitting: *Deleted

## 2021-10-21 DIAGNOSIS — Z20822 Contact with and (suspected) exposure to covid-19: Secondary | ICD-10-CM | POA: Insufficient documentation

## 2021-10-21 DIAGNOSIS — Z85118 Personal history of other malignant neoplasm of bronchus and lung: Secondary | ICD-10-CM | POA: Insufficient documentation

## 2021-10-21 DIAGNOSIS — J441 Chronic obstructive pulmonary disease with (acute) exacerbation: Secondary | ICD-10-CM | POA: Insufficient documentation

## 2021-10-21 DIAGNOSIS — J101 Influenza due to other identified influenza virus with other respiratory manifestations: Secondary | ICD-10-CM | POA: Insufficient documentation

## 2021-10-21 DIAGNOSIS — Z87891 Personal history of nicotine dependence: Secondary | ICD-10-CM | POA: Insufficient documentation

## 2021-10-21 DIAGNOSIS — Z7951 Long term (current) use of inhaled steroids: Secondary | ICD-10-CM | POA: Insufficient documentation

## 2021-10-21 DIAGNOSIS — R059 Cough, unspecified: Secondary | ICD-10-CM | POA: Diagnosis present

## 2021-10-21 DIAGNOSIS — J45909 Unspecified asthma, uncomplicated: Secondary | ICD-10-CM | POA: Insufficient documentation

## 2021-10-21 LAB — CBC WITH DIFFERENTIAL/PLATELET
Abs Immature Granulocytes: 0.03 10*3/uL (ref 0.00–0.07)
Basophils Absolute: 0 10*3/uL (ref 0.0–0.1)
Basophils Relative: 0 %
Eosinophils Absolute: 0 10*3/uL (ref 0.0–0.5)
Eosinophils Relative: 0 %
HCT: 40.3 % (ref 36.0–46.0)
Hemoglobin: 12.8 g/dL (ref 12.0–15.0)
Immature Granulocytes: 0 %
Lymphocytes Relative: 9 %
Lymphs Abs: 1.1 10*3/uL (ref 0.7–4.0)
MCH: 28.8 pg (ref 26.0–34.0)
MCHC: 31.8 g/dL (ref 30.0–36.0)
MCV: 90.8 fL (ref 80.0–100.0)
Monocytes Absolute: 1.1 10*3/uL — ABNORMAL HIGH (ref 0.1–1.0)
Monocytes Relative: 9 %
Neutro Abs: 10.2 10*3/uL — ABNORMAL HIGH (ref 1.7–7.7)
Neutrophils Relative %: 82 %
Platelets: 252 10*3/uL (ref 150–400)
RBC: 4.44 MIL/uL (ref 3.87–5.11)
RDW: 13 % (ref 11.5–15.5)
WBC: 12.4 10*3/uL — ABNORMAL HIGH (ref 4.0–10.5)
nRBC: 0 % (ref 0.0–0.2)

## 2021-10-21 LAB — BASIC METABOLIC PANEL
Anion gap: 7 (ref 5–15)
BUN: 8 mg/dL (ref 6–20)
CO2: 32 mmol/L (ref 22–32)
Calcium: 9 mg/dL (ref 8.9–10.3)
Chloride: 98 mmol/L (ref 98–111)
Creatinine, Ser: 0.62 mg/dL (ref 0.44–1.00)
GFR, Estimated: >60 mL/min (ref >60)
Glucose, Bld: 135 mg/dL — ABNORMAL HIGH (ref 70–99)
Potassium: 3.8 mmol/L (ref 3.5–5.1)
Sodium: 137 mmol/L (ref 135–145)

## 2021-10-21 LAB — D-DIMER, QUANTITATIVE: D-Dimer, Quant: 0.45 ug/mL-FEU (ref 0.00–0.50)

## 2021-10-21 LAB — RESP PANEL BY RT-PCR (FLU A&B, COVID) ARPGX2
Influenza A by PCR: POSITIVE — AB
Influenza B by PCR: NEGATIVE
SARS Coronavirus 2 by RT PCR: NEGATIVE

## 2021-10-21 LAB — MAGNESIUM: Magnesium: 1.8 mg/dL (ref 1.7–2.4)

## 2021-10-21 LAB — TROPONIN I (HIGH SENSITIVITY): Troponin I (High Sensitivity): 4 ng/L (ref <18)

## 2021-10-21 MED ORDER — METHYLPREDNISOLONE SODIUM SUCC 125 MG IJ SOLR
125.0000 mg | Freq: Once | INTRAMUSCULAR | Status: AC
  Administered 2021-10-21: 22:00:00 125 mg via INTRAVENOUS
  Filled 2021-10-21: qty 2

## 2021-10-21 MED ORDER — OSELTAMIVIR PHOSPHATE 75 MG PO CAPS: 75.0000 mg | ORAL_CAPSULE | Freq: Two times a day (BID) | ORAL | 0 refills | Status: AC

## 2021-10-21 MED ORDER — PREDNISONE 20 MG PO TABS
60.0000 mg | ORAL_TABLET | Freq: Once | ORAL | Status: AC
  Administered 2021-10-21: 18:00:00 60 mg via ORAL
  Filled 2021-10-21: qty 3

## 2021-10-21 MED ORDER — PREDNISONE 20 MG PO TABS: 20.0000 mg | ORAL_TABLET | Freq: Two times a day (BID) | ORAL | 0 refills | Status: AC

## 2021-10-21 MED ORDER — ALBUTEROL SULFATE (2.5 MG/3ML) 0.083% IN NEBU
2.5000 mg | INHALATION_SOLUTION | Freq: Once | RESPIRATORY_TRACT | Status: AC
  Administered 2021-10-21: 22:00:00 2.5 mg via RESPIRATORY_TRACT
  Filled 2021-10-21: qty 3

## 2021-10-21 MED ORDER — ALBUTEROL SULFATE HFA 108 (90 BASE) MCG/ACT IN AERS
2.0000 | INHALATION_SPRAY | Freq: Once | RESPIRATORY_TRACT | Status: AC
  Administered 2021-10-21: 18:00:00 2 via RESPIRATORY_TRACT
  Filled 2021-10-21: qty 6.7

## 2021-10-21 MED ORDER — DOXYCYCLINE HYCLATE 100 MG PO CAPS: 100.0000 mg | ORAL_CAPSULE | Freq: Two times a day (BID) | ORAL | 0 refills | Status: AC

## 2021-10-21 MED ORDER — IPRATROPIUM-ALBUTEROL 0.5-2.5 (3) MG/3ML IN SOLN
3.0000 mL | Freq: Once | RESPIRATORY_TRACT | Status: AC
  Administered 2021-10-21: 22:00:00 3 mL via RESPIRATORY_TRACT
  Filled 2021-10-21: qty 3

## 2021-10-21 MED ORDER — SODIUM CHLORIDE 0.9 % IV SOLN
1.0000 g | Freq: Once | INTRAVENOUS | Status: AC
  Administered 2021-10-21: 22:00:00 1 g via INTRAVENOUS
  Filled 2021-10-21: qty 10

## 2021-10-21 NOTE — ED Triage Notes (Signed)
The pt is c/o a cough and sob for 3-4 days  productive cough   she lives on home 02   she is also c/o a headache  she took ibu around 4 hours ago

## 2021-10-21 NOTE — Discharge Instructions (Signed)
Call your primary care doctor or specialist as discussed in the next 2-3 days.   Return immediately back to the ER if:  Your symptoms worsen within the next 12-24 hours. You develop new symptoms such as new fevers, persistent vomiting, new pain, shortness of breath, or new weakness or numbness, or if you have any other concerns.  

## 2021-10-21 NOTE — ED Notes (Signed)
The pts husband came home with a cold  and she has been ill since then

## 2021-10-21 NOTE — ED Provider Notes (Signed)
[name]   CSN: [id number] Arrival date & time: 10/21/21  1717     History Chief Complaint  Patient presents with   Shortness of Breath    Tonya Gordon is a 55 y.o. female.  Patient presents ER chief complaint of cough congestion generalized body aches chest tightness.  Symptoms ongoing for the past 3 days now she has history of stage IV adenocarcinoma followed by awake.  On 2 L nasal cannula at baseline.        Past Medical History:  Diagnosis Date   Asthma    COPD (chronic obstructive pulmonary disease) ([facility])    SVT (supraventricular tachycardia) ([place])     Patient Active Problem List   Diagnosis Date Noted   Adenocarcinoma of left lung, stage 4 ([place]) 09/06/2019   Goals of care, counseling/discussion 09/06/2019   Encounter for antineoplastic immunotherapy 09/06/2019    Past Surgical History:  Procedure Laterality Date   left upper lobectomy       OB History   No obstetric history on file.     Family History  Problem Relation Age of Onset   Hypertension Mother     Social History   Tobacco Use   Smoking status: Former    Packs/day: 1.00    Years: 30.00    Pack years: 30.00    Types: Cigarettes    Quit date: 07/06/2016    Years since quitting: 5.2   Smokeless tobacco: Never  Substance Use Topics   Alcohol use: Yes    Comment: Once a week    Home Medications Prior to Admission medications   Medication Sig Start Date End Date Taking? Authorizing Provider  doxycycline (VIBRAMYCIN) 100 MG capsule Take 1 capsule (100 mg total) by mouth 2 (two) times daily for 7 days. 10/21/21 10/28/21 Yes Luna Fuse, MD  oseltamivir (TAMIFLU) 75 MG capsule Take 1 capsule (75 mg total) by mouth every 12 (twelve) hours. 10/21/21  Yes Luna Fuse, MD  predniSONE (DELTASONE) 20 MG tablet Take 1 tablet (20 mg total) by mouth 2 (two) times daily with a meal for 5 days. 10/21/21 10/26/21 Yes Luna Fuse, MD   albuterol (PROVENTIL) (2.5 MG/3ML) 0.083% nebulizer solution Inhale 3 mLs into the lungs every 4 (four) hours as needed. 07/09/16   [provider]  albuterol (VENTOLIN HFA) 108 (90 Base) MCG/ACT inhaler Inhale 1 puff into the lungs every 4 (four) hours as needed. 02/16/17   [provider]  budesonide-formoterol (SYMBICORT) 160-4.5 MCG/ACT inhaler Inhale 2 puffs into the lungs 2 (two) times daily.    [provider]  busPIRone (BUSPAR) 15 MG tablet Take 15 mg by mouth 2 (two) times daily. 10/01/18   [provider]  desvenlafaxine (PRISTIQ) 50 MG 24 hr tablet Take 50 mg by mouth daily. 10/01/18   [provider]  diltiazem (CARDIZEM CD) 180 MG 24 hr capsule Take 180 mg by mouth daily. 03/04/19   [provider]  OXYGEN 2 L.    [provider]  zolpidem (AMBIEN) 5 MG tablet Take 5 mg by mouth at bedtime. 10/01/18   [provider]    Allergies    Clarithromycin  Review of Systems   Review of Systems  Constitutional:  Positive for fever.  HENT:  Negative for ear pain.   Eyes:  Negative for pain.  Respiratory:  Positive for cough and shortness of breath.   Cardiovascular:  Positive for chest pain.  Gastrointestinal:  Negative for abdominal pain.  Genitourinary:  Negative for flank pain.  Musculoskeletal:  Negative for back pain.  Skin:  Negative for rash.  Neurological:  Negative for headaches.   Physical Exam Updated Vital Signs BP 114/69   Pulse (!) 113   Temp 99.1 F (37.3 C) (Oral)   Resp (!) 26   Ht 5\' 3"  (1.6 m)   Wt 86.2 kg   SpO2 100%   BMI 33.66 kg/m   Physical Exam  ED Results / Procedures / Treatments   Labs (all labs ordered are listed, but only abnormal results are displayed) Labs Reviewed  RESP PANEL BY RT-PCR (FLU A&B, COVID) ARPGX2 - Abnormal; Notable for the following components:      Result Value   Influenza A by PCR POSITIVE (*)    All other components within normal limits  CBC WITH  DIFFERENTIAL/PLATELET - Abnormal; Notable for the following components:   WBC 12.4 (*)    Neutro Abs 10.2 (*)    Monocytes Absolute 1.1 (*)    All other components within normal limits  BASIC METABOLIC PANEL - Abnormal; Notable for the following components:   Glucose, Bld 135 (*)    All other components within normal limits  D-DIMER, QUANTITATIVE  MAGNESIUM  TROPONIN I (HIGH SENSITIVITY)    EKG None  Radiology DG Chest 2 View  Result Date: 10/21/2021 CLINICAL DATA:  Shortness of breath. EXAM: CHEST - 2 VIEW COMPARISON:  CT chest 08/14/2021.  Chest x-ray 07/01/2020. FINDINGS: There are patchy airspace opacities in the right lung base, new from prior. There is no pleural effusion or pneumothorax. Chronic scarring in the left lower lung appears unchanged. Surgical clips overlie the right lung apex. The heart is enlarged, unchanged. Volume loss on the left is unchanged. No acute fractures are seen. IMPRESSION: 1. New right lower lobe airspace disease worrisome for infection. Followup PA and lateral chest X-ray is recommended in 3-4 weeks following trial of antibiotic therapy to ensure resolution and exclude underlying malignancy. Electronically Signed   By: Ronney Asters M.D.   On: 10/21/2021 18:24    Procedures .Critical Care Performed by: Luna Fuse, MD Authorized by: Luna Fuse, MD   Critical care provider statement:    Critical care time (minutes):  40   Critical care time was exclusive of:  Separately billable procedures and treating other patients and teaching time   Critical care was necessary to treat or prevent imminent or life-threatening deterioration of the following conditions:  Respiratory failure Comments:     Multiple breathing treatments required   Medications Ordered in ED Medications  predniSONE (DELTASONE) tablet 60 mg (60 mg Oral Given 10/21/21 1759)  albuterol (VENTOLIN HFA) 108 (90 Base) MCG/ACT inhaler 2 puff (2 puffs Inhalation Given 10/21/21 1759)   methylPREDNISolone sodium succinate (SOLU-MEDROL) 125 mg/2 mL injection 125 mg (125 mg Intravenous Given 10/21/21 2133)  ipratropium-albuterol (DUONEB) 0.5-2.5 (3) MG/3ML nebulizer solution 3 mL (3 mLs Nebulization Given 10/21/21 2132)  albuterol (PROVENTIL) (2.5 MG/3ML) 0.083% nebulizer solution 2.5 mg (2.5 mg Nebulization Given 10/21/21 2132)  cefTRIAXone (ROCEPHIN) 1 g in sodium chloride 0.9 % 100 mL IVPB (0 g Intravenous Stopped 10/21/21 2204)    ED Course  I have reviewed the triage vital signs and the nursing notes.  Pertinent labs & imaging results that were available during my care of the patient were reviewed by me and considered in my medical decision making (see chart for details).    MDM Rules/Calculators/A&P  Work-up shows influenza A positive.  Patient otherwise has negative D-dimer negative troponin.  Labs and chemistry otherwise unremarkable.  Patient is diffusely wheezing.  Given multiple breathing treatments with improvement of symptoms.  Will be given Rocephin as a chest x-ray showed a possible infiltrate forming.  Advised outpatient follow-up with a specialist within the next 2 to 4 days.  Recommending immediate return for worsening symptoms or any additional concerns.  Final Clinical Impression(s) / ED Diagnoses Final diagnoses:  Influenza A  COPD exacerbation ([place])    Rx / DC Orders ED Discharge Orders          Ordered    doxycycline (VIBRAMYCIN) 100 MG capsule  2 times daily        10/21/21 2226    predniSONE (DELTASONE) 20 MG tablet  2 times daily with meals        10/21/21 2226    oseltamivir (TAMIFLU) 75 MG capsule  Every 12 hours        10/21/21 2226             Luna Fuse, MD 10/21/21 2227

## 2021-10-21 NOTE — ED Provider Notes (Signed)
Emergency Medicine Provider Triage Evaluation Note  Cristy Friedlander , a 55 y.o. female  was evaluated in triage.  Pt complains of shortness of breath, cough, chest pain.  Does have sick members in the family.  She has history of COPD, chronic 2 L [place].  Has stage IV lung adenocarcinoma, followed by [facility].  History of PE, DVT.  No lower extremity swelling.  Feels generally unwell.   Review of Systems  Positive: CP, SOB, cough Negative: Fever, emesis  Physical Exam  BP 110/75   Pulse (!) 122   Temp 98.8 F (37.1 C) (Oral)   Resp (!) 24   SpO2 99%  Gen:   Awake, no distress   Resp:  Increased WOB, diffuse expiratory wheeze Cardiac: tachycardia MSK:   Moves extremities without difficulty  Other:    Medical Decision Making  Medically screening exam initiated at 5:45 PM.  Appropriate orders placed.  Mennie Sidney Ace was informed that the remainder of the evaluation will be completed by another provider, this initial triage assessment does not replace that evaluation, and the importance of remaining in the ED until their evaluation is complete.  Chest pain, shortness of breath, tachycardia, stage IV adenocarcinoma   Zoeann Mol A, PA-C 10/21/21 1749    Wyvonnia Dusky, MD 10/21/21 2108

## 2022-04-14 IMAGING — CR DG CHEST 2V
2 series · 2 of 2 positions shown · non-contrast
Comparison: CT chest 08/14/2021.  Chest x-ray 07/01/2020.

CLINICAL DATA: Shortness of breath.

EXAM:
CHEST - 2 VIEW

[chest lat]
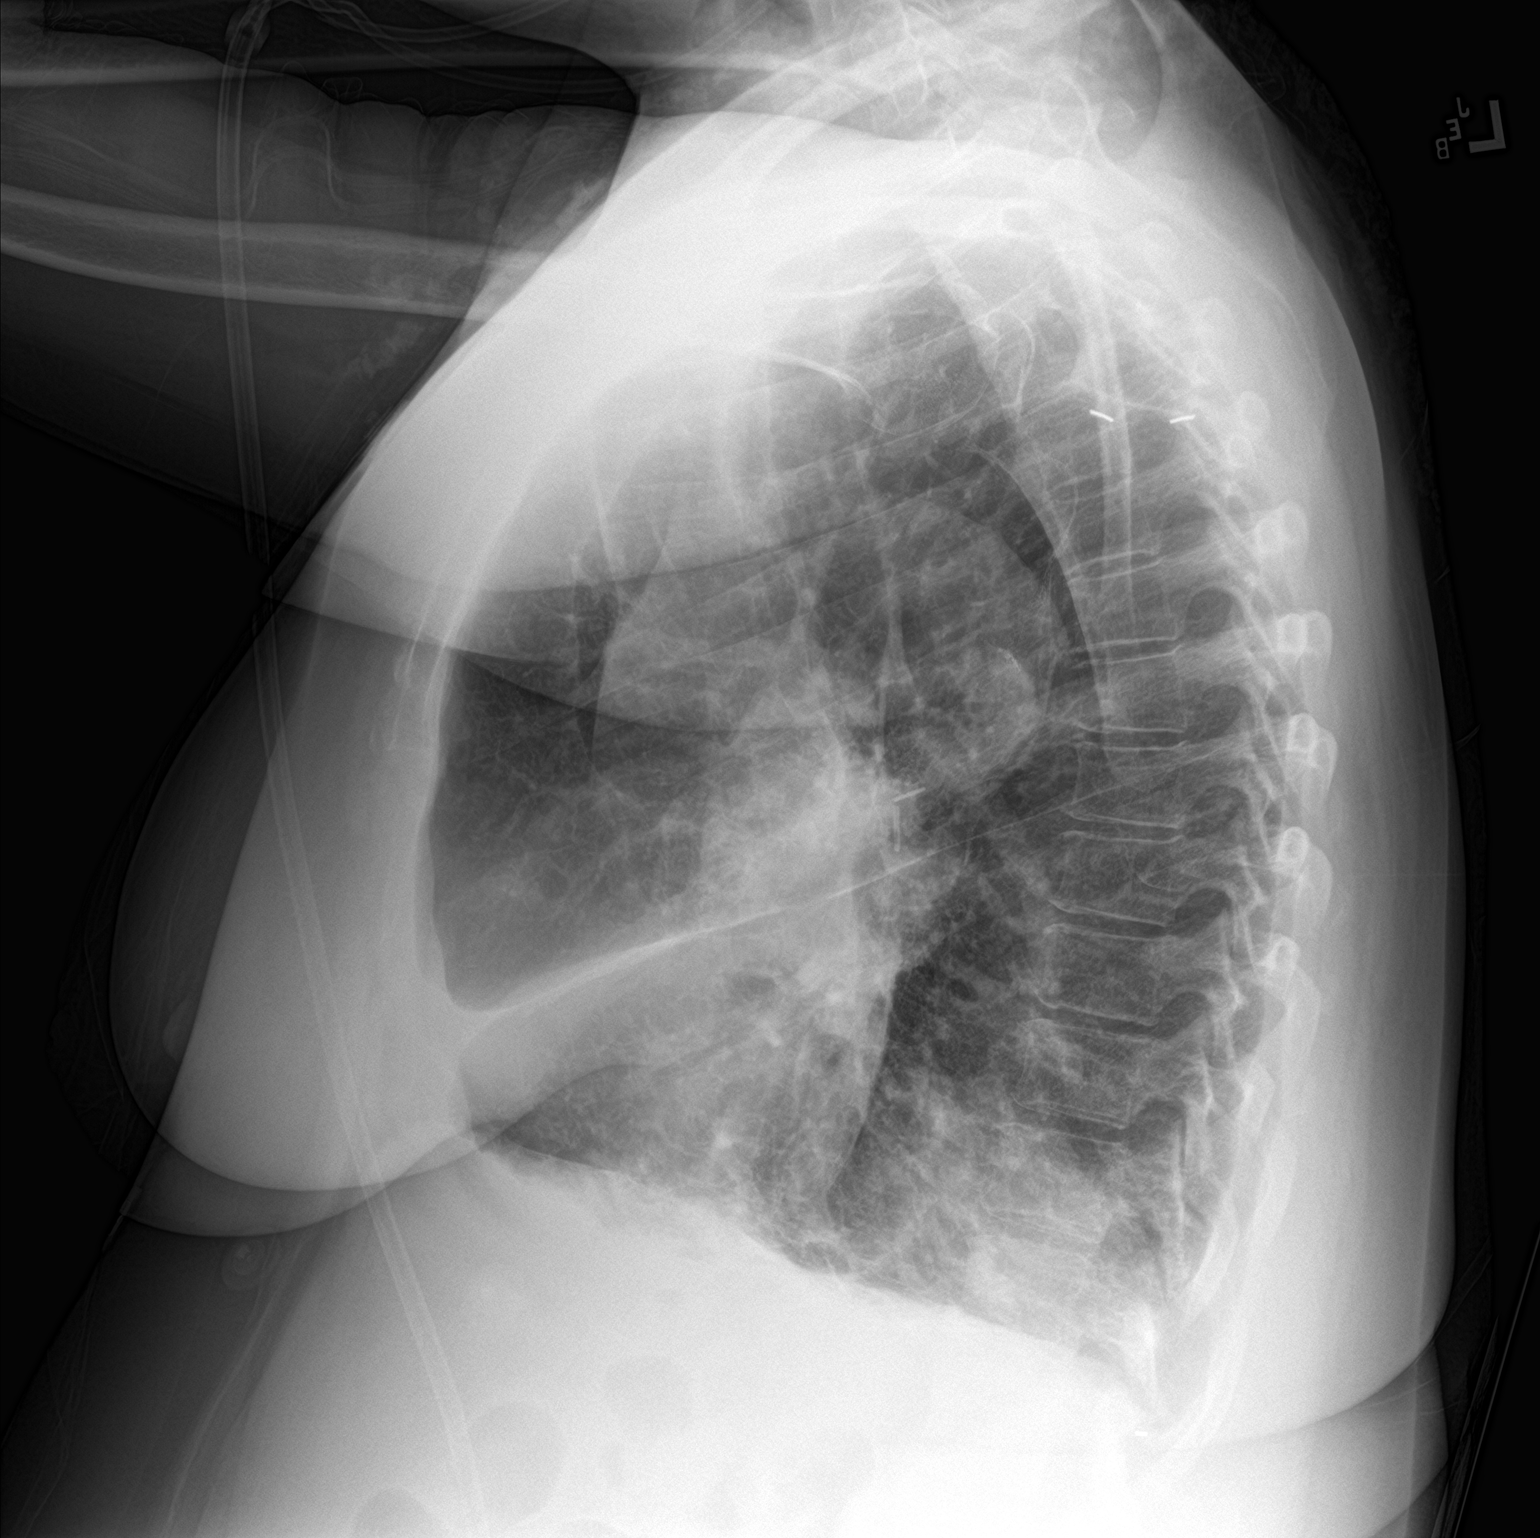

[chest ap]
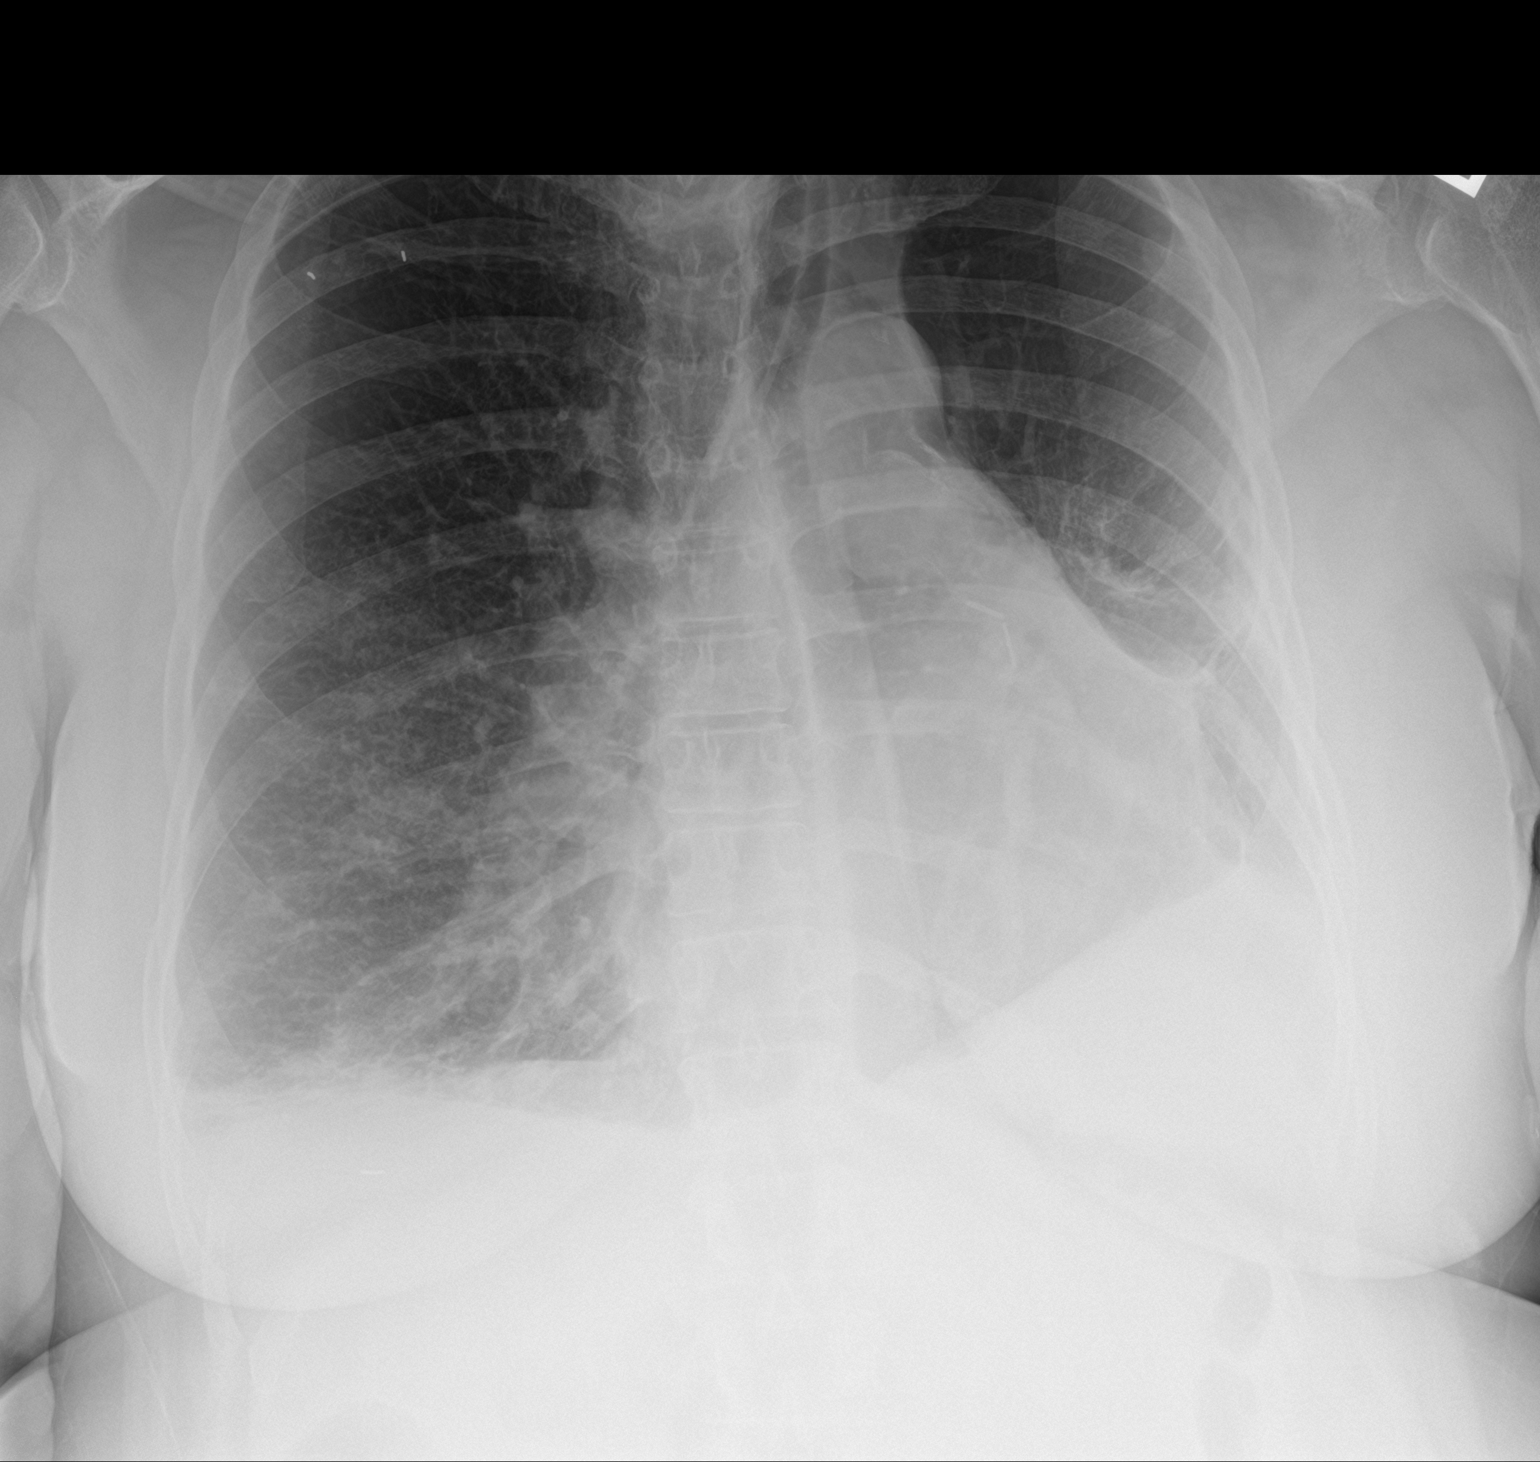

[2 of 2 positions shown; findings below may reference images not displayed]

FINDINGS: There are patchy airspace opacities in the right lung base, new from
prior. There is no pleural effusion or pneumothorax. Chronic
scarring in the left lower lung appears unchanged. Surgical clips
overlie the right lung apex. The heart is enlarged, unchanged.
Volume loss on the left is unchanged. No acute fractures are seen.
IMPRESSION: 1. New right lower lobe airspace disease worrisome for infection.
Followup PA and lateral chest X-ray is recommended in 3-4 weeks
following trial of antibiotic therapy to ensure resolution and
exclude underlying malignancy.

## 2022-06-17 ENCOUNTER — Other Ambulatory Visit: Payer: Self-pay
